# Patient Record
Sex: Male | Born: 1970 | Race: Black or African American | Hispanic: No | Marital: Single | Smoking: Current every day smoker
Health system: Southern US, Community
[De-identification: ages and names within clinical notes are randomized; demographics above are authoritative.]

## PROBLEM LIST (undated history)

## (undated) DIAGNOSIS — F32A Depression, unspecified: Secondary | ICD-10-CM

## (undated) DIAGNOSIS — R569 Unspecified convulsions: Secondary | ICD-10-CM

## (undated) DIAGNOSIS — E669 Obesity, unspecified: Secondary | ICD-10-CM

## (undated) DIAGNOSIS — F329 Major depressive disorder, single episode, unspecified: Secondary | ICD-10-CM

## (undated) HISTORY — PX: LACERATION REPAIR: SHX5168

## (undated) HISTORY — PX: DESTRUCTION LESION PENILE: SUR414

---

## 2005-07-11 ENCOUNTER — Emergency Department: Payer: Self-pay | Admitting: Emergency Medicine

## 2009-09-01 ENCOUNTER — Emergency Department: Payer: Self-pay | Admitting: Emergency Medicine

## 2012-04-11 ENCOUNTER — Inpatient Hospital Stay: Payer: Self-pay | Admitting: Internal Medicine

## 2012-04-11 LAB — CARBAMAZEPINE LEVEL, TOTAL: Carbamazepine: 3.8 ug/mL — ABNORMAL LOW (ref 4.0–12.0)

## 2012-04-11 LAB — COMPREHENSIVE METABOLIC PANEL
Anion Gap: 9 (ref 7–16)
BUN: 11 mg/dL (ref 7–18)
Calcium, Total: 8.9 mg/dL (ref 8.5–10.1)
Chloride: 103 mmol/L (ref 98–107)
Co2: 26 mmol/L (ref 21–32)
EGFR (African American): >60
EGFR (Non-African Amer.): >60
Osmolality: 276 (ref 275–301)
Potassium: 3.5 mmol/L (ref 3.5–5.1)
SGOT(AST): 24 U/L (ref 15–37)
SGPT (ALT): 24 U/L (ref 12–78)
Total Protein: 8.2 g/dL (ref 6.4–8.2)

## 2012-04-11 LAB — URINALYSIS, COMPLETE
Bilirubin,UR: NEGATIVE
Hyaline Cast: 1
Nitrite: NEGATIVE
Ph: 6 (ref 4.5–8.0)
Protein: NEGATIVE
Specific Gravity: 1.012 (ref 1.003–1.030)

## 2012-04-11 LAB — CBC WITH DIFFERENTIAL/PLATELET
Basophil %: 0.4 %
Eosinophil #: 0 10*3/uL (ref 0.0–0.7)
Eosinophil %: 0.6 %
HCT: 42.8 % (ref 40.0–52.0)
HGB: 14.7 g/dL (ref 13.0–18.0)
Lymphocyte #: 1 10*3/uL (ref 1.0–3.6)
Lymphocyte %: 13.9 %
MCH: 31.8 pg (ref 26.0–34.0)
MCHC: 34.3 g/dL (ref 32.0–36.0)
MCV: 93 fL (ref 80–100)
Neutrophil #: 5.7 10*3/uL (ref 1.4–6.5)
RBC: 4.61 10*6/uL (ref 4.40–5.90)

## 2012-04-12 LAB — CBC WITH DIFFERENTIAL/PLATELET
Basophil #: 0 10*3/uL (ref 0.0–0.1)
Eosinophil #: 0 10*3/uL (ref 0.0–0.7)
Eosinophil %: 0.6 %
Lymphocyte #: 1.7 10*3/uL (ref 1.0–3.6)
Lymphocyte %: 21.7 %
MCH: 31.9 pg (ref 26.0–34.0)
Monocyte #: 0.8 x10 3/mm (ref 0.2–1.0)
Monocyte %: 10.2 %
Neutrophil %: 67 %
Platelet: 199 10*3/uL (ref 150–440)
RBC: 4.4 10*6/uL (ref 4.40–5.90)
RDW: 13.9 % (ref 11.5–14.5)
WBC: 8 10*3/uL (ref 3.8–10.6)

## 2012-04-12 LAB — BASIC METABOLIC PANEL
Anion Gap: 8 (ref 7–16)
BUN: 10 mg/dL (ref 7–18)
Calcium, Total: 8.9 mg/dL (ref 8.5–10.1)
Creatinine: 1.05 mg/dL (ref 0.60–1.30)
EGFR (African American): >60
EGFR (Non-African Amer.): >60
Glucose: 109 mg/dL — ABNORMAL HIGH (ref 65–99)
Osmolality: 275 (ref 275–301)
Potassium: 3.1 mmol/L — ABNORMAL LOW (ref 3.5–5.1)

## 2014-12-09 NOTE — H&P (Signed)
PATIENT NAME:  Devon Roberson, Devon Roberson MR#:  [id number] DATE OF BIRTH:  [birth year]  DATE OF ADMISSION:  04/11/2012  PRIMARY CARE PROVIDER: None.  ED REFERRING PHYSICIAN: Dr. Ulice Brilliant    CHIEF COMPLAINT: Multiple seizures.   HISTORY OF PRESENT ILLNESS: The patient is a 44 year old African American male who has been recently incarcerated in prison who has a history of recurrent seizures. He is on Keppra and is also on carbamazepine for his seizure disorder who was incarcerated and those medications were not on the formulary and he was not getting his Keppra. The patient had a tonic-clonic seizure in the prison and had a few of those in the prison. Exact amount is unknown. After he was brought to the ED he had more seizures, two more episodes in the ED, tonic-clonic lasting five minutes. The patient was given p.o. Keppra as well as IV Keppra. He also received a dose of Ativan. Currently he is postictal and is unable to give me any further history.   PAST MEDICAL HISTORY:  1. Seizure disorder.  2. Depression.   PAST SURGICAL HISTORY: Unknown.   ALLERGIES: None.  MEDICATIONS AT THE PRISON:  1. Carbamazepine 200 mg 4 tabs b.i.d.  2. Keppra XR 1000 mg at bedtime.  3. Paroxetine 20 daily.   SOCIAL HISTORY: The patient is unable to tell me whether he smokes, drinks, or uses drugs. He is currently incarcerated.   FAMILY HISTORY: Not known.    REVIEW OF SYSTEMS: The patient is currently postictal and very lethargic and hard to obtain.   PHYSICAL EXAMINATION:   VITAL SIGNS: Temperature 98, pulse 85, respirations 23, blood pressure 128/69, O2 93%.   GENERAL: The patient is an obese male in no acute distress, is currently drowsy and postictal.   HEENT: Head atraumatic, normocephalic. Pupils equally round, reactive to light and accommodation. There is no conjunctival pallor. No scleral icterus. Nasal exam shows no drainage or ulceration. Oropharynx is clear. No exudates.   NECK: No thyromegaly. No  carotid bruits.   CARDIOVASCULAR: Regular rate and rhythm. No murmurs, rubs, clicks, or gallops. PMI is not displaced.   LUNGS: Clear to auscultation bilaterally without any rales, rhonchi, or wheezing.   ABDOMEN: Soft, nontender, nondistended. Positive bowel sounds x4.   EXTREMITIES: No clubbing, cyanosis, or edema.   SKIN: No rash.   LYMPHATICS: No lymph nodes palpable.   VASCULAR: Good DP, PT pulses.   NEUROLOGICAL: The patient is currently lethargic but was moving all four extremities spontaneously.   EVALUATION IN THE ED: CT scan of the head which was negative.   Right shoulder x-ray showed no abnormality.   WBC 7.3, hemoglobin 14.7, platelet count 197. BMP glucose 107, BUN 11, creatinine 1.03, sodium 138, potassium 3.5, chloride 103, CO2 26. LFTs showed a slightly elevated alkaline phosphatase of 151.   CT scan of the head showed no acute abnormality.   ASSESSMENT AND PLAN: The patient is a 44 year old African American male with history of seizure disorder who is brought to the ED with recurrent seizures. The patient is incarcerated and has not been getting his medications. This is likely the cause for his seizure.  1. Recurrent seizure due to the patient missing dose of his seizure medications. At this time he has already been given IV Keppra. Will continue higher dose p.o. Keppra. Will do Ativan p.r.n. Continue carbamazepine as taking previously. Will also check Tegretol level. We will not get Keppra level due to it will likely take a long time to get  those back and will not change his management.  2. Depression. Will continue Paxil.  3. Miscellaneous. I will place him on Lovenox for DVT prophylaxis.   TIME SPENT: 35 minutes.   ____________________________ Lafonda Mosses Posey Pronto, MD shp:drc D: 04/11/2012 11:39:56 ET T: 04/11/2012 12:04:42 ET JOB#: [id number]  cc: Arleigh Dicola H. Posey Pronto, MD, <Dictator> Alric Seton MD ELECTRONICALLY SIGNED 04/13/2012 16:02

## 2014-12-09 NOTE — Discharge Summary (Signed)
PATIENT NAME:  Devon Roberson, Devon Roberson MR#:  [id number] DATE OF BIRTH:  1970-10-19  DATE OF ADMISSION:  04/11/2012 DATE OF DISCHARGE:  04/12/2012   ADMITTING DIAGNOSIS: Recurrent seizures.   DISCHARGE DIAGNOSES:  1. Recurrent seizures due to patient not getting his seizure medication. The patient was restarted back on his Keppra and carbamazepine. He has not had any further seizure since last night.  2. Depression.  3. Hypokalemia.   CONSULTANTS: None.   PERTINENT LABS AND EVALUATIONS: Admitting glucose 109, BUN 11, creatinine 1.03, sodium 138, potassium 3.5, chloride 103, CO2 26, calcium 8.9. LFTs were normal except alkaline phosphatase was slightly elevated at 151. Tegretol level 3.8. WBC 7.3, hemoglobin 14.7, platelet count 197.   CT scan of the head showed no acute abnormality.   HOSPITAL COURSE: Please refer to history and physical done by the admitting physician. The patient is a 44 year old African American male with history of seizure disorder who is recently incarcerated in prison who is chronically on Keppra and carbamazepine for seizure and apparently he did not receive his dosage of medication because they had to be ordered. The patient was noted to have a few episodes of tonic-clonic activity in the prison and was brought to the ED. He had another episode. In the ER he was loaded with IV Keppra. He received Ativan with resolution of the seizures. The patient was kept on his regular regimen of carbamazepine as well as Keppra. The patient has been stable overnight. He has not had any further seizure activity. At this time he is stable for discharge.   DISCHARGE MEDICATIONS:  1. Epitol 200 mg 4 tabs 2 times per day. 2. Paroxetine 20 daily.  3. Keppra XR 500 extended-release 2 tabs daily at bedtime.   DIET: Regular.   ACTIVITY: As tolerated.   TIMEFRAME FOR FOLLOW-UP: Follow-up in 1 to 2 weeks with prison MD or primary MD when able. The patient is strongly instructed to take his seizure  medications and prison staff is strongly recommended to give him his medications so he does not miss a dose.   TIME SPENT: 31 minutes.   ____________________________ Lafonda Mosses Posey Pronto, MD shp:drc D: 04/12/2012 13:10:58 ET T: 04/12/2012 13:44:25 ET JOB#: [id number]  cc: Ladina Shutters H. Posey Pronto, MD, <Dictator> Alric Seton MD ELECTRONICALLY SIGNED 04/13/2012 16:03

## 2016-02-01 ENCOUNTER — Encounter: Payer: Self-pay | Admitting: *Deleted

## 2016-02-02 ENCOUNTER — Ambulatory Visit: Payer: Self-pay | Admitting: Anesthesiology

## 2016-02-02 ENCOUNTER — Encounter
Admission: RE | Disposition: A | Discharge status: Home / Self Care | Payer: Self-pay | Source: Ambulatory Visit | Attending: Gastroenterology

## 2016-02-02 ENCOUNTER — Ambulatory Visit
Admission: RE | Admit: 2016-02-02 | Discharge: 2016-02-02 | Disposition: A | Discharge status: Home / Self Care | Payer: Self-pay | Source: Ambulatory Visit | Attending: Gastroenterology | Admitting: Gastroenterology

## 2016-02-02 ENCOUNTER — Encounter: Payer: Self-pay | Admitting: Anesthesiology

## 2016-02-02 DIAGNOSIS — Z79899 Other long term (current) drug therapy: Secondary | ICD-10-CM | POA: Insufficient documentation

## 2016-02-02 DIAGNOSIS — K5730 Diverticulosis of large intestine without perforation or abscess without bleeding: Secondary | ICD-10-CM | POA: Insufficient documentation

## 2016-02-02 DIAGNOSIS — F17200 Nicotine dependence, unspecified, uncomplicated: Secondary | ICD-10-CM | POA: Insufficient documentation

## 2016-02-02 DIAGNOSIS — F329 Major depressive disorder, single episode, unspecified: Secondary | ICD-10-CM | POA: Insufficient documentation

## 2016-02-02 DIAGNOSIS — D124 Benign neoplasm of descending colon: Secondary | ICD-10-CM | POA: Insufficient documentation

## 2016-02-02 DIAGNOSIS — E669 Obesity, unspecified: Secondary | ICD-10-CM | POA: Insufficient documentation

## 2016-02-02 DIAGNOSIS — K625 Hemorrhage of anus and rectum: Secondary | ICD-10-CM | POA: Insufficient documentation

## 2016-02-02 DIAGNOSIS — R569 Unspecified convulsions: Secondary | ICD-10-CM | POA: Insufficient documentation

## 2016-02-02 DIAGNOSIS — D125 Benign neoplasm of sigmoid colon: Secondary | ICD-10-CM | POA: Insufficient documentation

## 2016-02-02 DIAGNOSIS — Z6833 Body mass index (BMI) 33.0-33.9, adult: Secondary | ICD-10-CM | POA: Insufficient documentation

## 2016-02-02 DIAGNOSIS — D123 Benign neoplasm of transverse colon: Secondary | ICD-10-CM | POA: Insufficient documentation

## 2016-02-02 HISTORY — DX: Unspecified convulsions: R56.9

## 2016-02-02 HISTORY — DX: Depression, unspecified: F32.A

## 2016-02-02 HISTORY — DX: Major depressive disorder, single episode, unspecified: F32.9

## 2016-02-02 HISTORY — PX: COLONOSCOPY WITH PROPOFOL: SHX5780

## 2016-02-02 HISTORY — DX: Obesity, unspecified: E66.9

## 2016-02-02 SURGERY — COLONOSCOPY WITH PROPOFOL
Anesthesia: General

## 2016-02-02 MED ORDER — PROPOFOL 500 MG/50ML IV EMUL
INTRAVENOUS | Status: DC | PRN
Start: 2016-02-02 — End: 2016-02-02
  Administered 2016-02-02: 120 ug/kg/min via INTRAVENOUS

## 2016-02-02 MED ORDER — SODIUM CHLORIDE 0.9 % IV SOLN
INTRAVENOUS | Status: DC
Start: 2016-02-02 — End: 2016-02-02

## 2016-02-02 MED ORDER — PROPOFOL 10 MG/ML IV BOLUS
INTRAVENOUS | Status: DC | PRN
  Administered 2016-02-02: 80 mg via INTRAVENOUS

## 2016-02-02 MED ORDER — MIDAZOLAM HCL 2 MG/2ML IJ SOLN
INTRAMUSCULAR | Status: DC | PRN
  Administered 2016-02-02: 1 mg via INTRAVENOUS

## 2016-02-02 MED ORDER — SODIUM CHLORIDE 0.9 % IV SOLN
INTRAVENOUS | Status: DC
  Administered 2016-02-02: 15:00:00 via INTRAVENOUS

## 2016-02-02 MED ORDER — FENTANYL CITRATE (PF) 100 MCG/2ML IJ SOLN
INTRAMUSCULAR | Status: DC | PRN
  Administered 2016-02-02: 50 ug via INTRAVENOUS

## 2016-02-02 NOTE — Anesthesia Preprocedure Evaluation (Signed)
Anesthesia Evaluation  Patient identified by MRN, date of birth, ID band Patient awake    Reviewed: Allergy & Precautions, NPO status , Patient's Chart, lab work & pertinent test results, reviewed documented beta blocker date and time   Airway Mallampati: II  TM Distance: >3 FB     Dental  (+) Chipped   Pulmonary Current Smoker,           Cardiovascular      Neuro/Psych Seizures -,  PSYCHIATRIC DISORDERS Depression    GI/Hepatic   Endo/Other    Renal/GU      Musculoskeletal   Abdominal   Peds  Hematology   Anesthesia Other Findings   Reproductive/Obstetrics                             Anesthesia Physical Anesthesia Plan  ASA: III  Anesthesia Plan: General   Post-op Pain Management:    Induction: Intravenous  Airway Management Planned: Nasal Cannula  Additional Equipment:   Intra-op Plan:   Post-operative Plan:   Informed Consent: I have reviewed the patients History and Physical, chart, labs and discussed the procedure including the risks, benefits and alternatives for the proposed anesthesia with the patient or authorized representative who has indicated his/her understanding and acceptance.     Plan Discussed with: CRNA  Anesthesia Plan Comments:         Anesthesia Quick Evaluation

## 2016-02-02 NOTE — Transfer of Care (Signed)
Immediate Anesthesia Transfer of Care Note  Patient: Devon Roberson  Procedure(s) Performed: Procedure(s): COLONOSCOPY WITH PROPOFOL (N/A)  Patient Location: PACU and Endoscopy Unit  Anesthesia Type:General  Level of Consciousness: sedated and responds to stimulation  Airway & Oxygen Therapy: Patient Spontanous Breathing and Patient connected to nasal cannula oxygen  Post-op Assessment: Report given to RN and Post -op Vital signs reviewed and stable  Post vital signs: Reviewed and stable  Last Vitals:  Filed Vitals:   02/02/16 1432 02/02/16 1606  BP: 137/86 105/69  Pulse: 57 54  Temp: 36.7 C 35.7 C  Resp: 18 21    Last Pain: There were no vitals filed for this visit.       Complications: No apparent anesthesia complications

## 2016-02-02 NOTE — H&P (Signed)
Outpatient short stay form Pre-procedure 02/02/2016 3:18 PM Lollie Sails MD  Primary Physician: Dr. Gayland Curry  Reason for visit:  Colonoscopy  History of present illness:  Patient is a 45 year old male presenting today for a colonoscopy. He has a history of some intermittent rectal bleeding. He had a colonoscopy in the past. He also has some lower abdominal cramping prior to defecation which is usually relieved with the defecation. He tolerated his prep well. He denies use of any aspirin products or blood thinning agents.    Current facility-administered medications:  .  0.9 %  sodium chloride infusion, , Intravenous, Continuous, Lollie Sails, MD, Last Rate: 20 mL/hr at 02/02/16 1444 .  0.9 %  sodium chloride infusion, , Intravenous, Continuous, Lollie Sails, MD  Prescriptions prior to admission  Medication Sig Dispense Refill Last Dose  . carbamazepine (TEGRETOL) 200 MG tablet Take 200 mg by mouth 3 (three) times daily.   02/02/2016 at 0800  . levETIRAcetam (KEPPRA) 750 MG tablet Take 1,500 mg by mouth 2 (two) times daily.   02/02/2016 at 0800  . loratadine (CLARITIN) 10 MG tablet Take 10 mg by mouth daily as needed for allergies.     Marland Kitchen PARoxetine (PAXIL) 20 MG tablet Take 20 mg by mouth daily.   02/02/2016 at 0800     No Known Allergies   Past Medical History  Diagnosis Date  . Depression   . Seizures ([place])   . Obesity     Review of systems:      Physical Exam    Heart and lungs: Regular rate and rhythm without rub or gallop, lungs are bilaterally clear.    HEENT: Normocephalic atraumatic eyes are anicteric    Other:     Pertinant exam for procedure: Soft nontender nondistended bowel sounds positive normoactive.  Planned proceedures: Colonoscopy and indicated procedures. I have discussed the risks benefits and complications of procedures to include not limited to bleeding, infection, perforation and the risk of sedation and the patient wishes to  proceed.    Lollie Sails, MD Gastroenterology 02/02/2016  3:18 PM

## 2016-02-02 NOTE — Op Note (Signed)
[facility]enter[facility] Patient Name: Devon Roberson Procedure Date: 02/02/2016 3:20 PM MRN: [id number] Account #: [id number] Date of Birth: 04/11/1971 Admit Type: Outpatient Age: 45 Room: [facility] ENDO ROOM 3 Gender: Male Note Status: Finalized Procedure:            Colonoscopy Indications:          Screening for colorectal malignant neoplasm, This is                        the patient's first colonoscopy Providers:            Lollie Sails, MD Referring MD:         Gayland Curry MD, MD (Referring MD) Medicines:            Monitored Anesthesia Care Complications:        No immediate complications. Procedure:            Pre-Anesthesia Assessment:                       - ASA Grade Assessment: II - A patient with mild                        systemic disease.                       After obtaining informed consent, the colonoscope was                        passed under direct vision. Throughout the procedure,                        the patient's blood pressure, pulse, and oxygen                        saturations were monitored continuously. The                        Colonoscope was introduced through the anus and                        advanced to the the cecum, identified by appendiceal                        orifice and ileocecal valve. The colonoscopy was                        performed without difficulty. The patient tolerated the                        procedure well. The quality of the bowel preparation                        was good. Findings:      A 2 mm polyp was found in the distal descending colon. The polyp was       sessile. The polyp was removed with a cold biopsy forceps. Resection and       retrieval were complete.      A 3 mm polyp was found in the transverse colon. The polyp was sessile.       The polyp was removed with a cold biopsy forceps. Resection  and       retrieval were complete.      A 4 mm polyp was found in the splenic  flexure. The polyp was flat. The       polyp was removed with a cold snare. Resection and retrieval were       complete.      A 3 mm polyp was found in the mid sigmoid colon. The polyp was sessile.       The polyp was removed with a cold biopsy forceps. Resection and       retrieval were complete.      A few small-mouthed diverticula were found in the sigmoid colon and       descending colon.      The digital rectal exam was normal. Impression:           - One 2 mm polyp in the distal descending colon,                        removed with a cold biopsy forceps. Resected and                        retrieved.                       - One 3 mm polyp in the transverse colon, removed with                        a cold biopsy forceps. Resected and retrieved.                       - One 4 mm polyp at the splenic flexure, removed with a                        cold snare. Resected and retrieved.                       - One 3 mm polyp in the mid sigmoid colon, removed with                        a cold biopsy forceps. Resected and retrieved.                       - Diverticulosis in the sigmoid colon and in the                        descending colon. Recommendation:       - Discharge patient to home. Procedure Code(s):    --- Professional ---                       [phone], Colonoscopy, flexible; with removal of tumor(s),                        polyp(s), or other lesion(s) by snare technique                       [id number], 2, Colonoscopy, flexible; with biopsy, single                        or multiple Diagnosis Code(s):    --- Professional ---  Z12.11, Encounter for screening for malignant neoplasm                        of colon                       D12.4, Benign neoplasm of descending colon                       D12.3, Benign neoplasm of transverse colon (hepatic                        flexure or splenic flexure)                       D12.5, Benign neoplasm of sigmoid colon                        K57.30, Diverticulosis of large intestine without                        perforation or abscess without bleeding CPT copyright 2016 American Medical Association. All rights reserved. The codes documented in this report are preliminary and upon coder review may  be revised to meet current compliance requirements. Lollie Sails, MD 02/02/2016 4:03:33 PM This report has been signed electronically. Number of Addenda: 0 Note Initiated On: 02/02/2016 3:20 PM Scope Withdrawal Time: 0 hours 14 minutes 52 seconds  Total Procedure Duration: 0 hours 24 minutes 21 seconds       [facility]

## 2016-02-03 ENCOUNTER — Encounter: Payer: Self-pay | Admitting: Gastroenterology

## 2016-02-03 NOTE — Anesthesia Postprocedure Evaluation (Signed)
Anesthesia Post Note  Patient: Devon Roberson  Procedure(s) Performed: Procedure(s) (LRB): COLONOSCOPY WITH PROPOFOL (N/A)  Patient location during evaluation: PACU Level of consciousness: awake and alert and oriented Pain management: pain level controlled Vital Signs Assessment: post-procedure vital signs reviewed and stable Respiratory status: spontaneous breathing Cardiovascular status: blood pressure returned to baseline Anesthetic complications: no    Last Vitals:  Filed Vitals:   02/02/16 1626 02/02/16 1636  BP: 128/92 132/86  Pulse: 53 59  Temp:    Resp: 20 18    Last Pain: There were no vitals filed for this visit.               [name]

## 2016-02-04 LAB — SURGICAL PATHOLOGY

## 2019-05-16 ENCOUNTER — Emergency Department: Payer: Medicaid Other

## 2019-05-16 ENCOUNTER — Other Ambulatory Visit: Payer: Self-pay

## 2019-05-16 ENCOUNTER — Encounter: Payer: Self-pay | Admitting: Emergency Medicine

## 2019-05-16 ENCOUNTER — Emergency Department
Admission: EM | Admit: 2019-05-16 | Discharge: 2019-05-16 | Disposition: A | Discharge status: Home / Self Care | Payer: Medicaid Other | Attending: Emergency Medicine | Admitting: Emergency Medicine

## 2019-05-16 DIAGNOSIS — Y9389 Activity, other specified: Secondary | ICD-10-CM | POA: Insufficient documentation

## 2019-05-16 DIAGNOSIS — Y929 Unspecified place or not applicable: Secondary | ICD-10-CM | POA: Diagnosis not present

## 2019-05-16 DIAGNOSIS — S43015A Anterior dislocation of left humerus, initial encounter: Secondary | ICD-10-CM | POA: Diagnosis not present

## 2019-05-16 DIAGNOSIS — X58XXXA Exposure to other specified factors, initial encounter: Secondary | ICD-10-CM | POA: Insufficient documentation

## 2019-05-16 DIAGNOSIS — Y999 Unspecified external cause status: Secondary | ICD-10-CM | POA: Insufficient documentation

## 2019-05-16 DIAGNOSIS — F17210 Nicotine dependence, cigarettes, uncomplicated: Secondary | ICD-10-CM | POA: Diagnosis not present

## 2019-05-16 DIAGNOSIS — S43005A Unspecified dislocation of left shoulder joint, initial encounter: Secondary | ICD-10-CM

## 2019-05-16 DIAGNOSIS — S4992XA Unspecified injury of left shoulder and upper arm, initial encounter: Secondary | ICD-10-CM | POA: Diagnosis present

## 2019-05-16 MED ORDER — HYDROMORPHONE HCL 1 MG/ML IJ SOLN
1.0000 mg | Freq: Once | INTRAMUSCULAR | Status: AC
  Administered 2019-05-16: 1 mg via INTRAVENOUS
  Filled 2019-05-16: qty 1

## 2019-05-16 MED ORDER — HYDROMORPHONE HCL 1 MG/ML IJ SOLN
1.0000 mg | Freq: Once | INTRAMUSCULAR | Status: AC
  Administered 2019-05-16: 13:00:00 1 mg via INTRAVENOUS
  Filled 2019-05-16: qty 1

## 2019-05-16 MED ORDER — OXYCODONE-ACETAMINOPHEN 5-325 MG PO TABS: 1.0000 | ORAL_TABLET | Freq: Two times a day (BID) | ORAL | 0 refills | Status: DC | PRN

## 2019-05-16 MED ORDER — DIAZEPAM 5 MG PO TABS
10.0000 mg | ORAL_TABLET | Freq: Once | ORAL | Status: AC
  Administered 2019-05-16: 13:00:00 10 mg via ORAL
  Filled 2019-05-16: qty 2

## 2019-05-16 NOTE — ED Notes (Signed)
Patient placed on monitor

## 2019-05-16 NOTE — ED Notes (Signed)
Attempted to call pt ride at this time with no answer, will try again

## 2019-05-16 NOTE — ED Triage Notes (Signed)
Pt states he was working under a bike yesterday, left shoulder dislocated, pt states he has popped it back into place 3 times already, this last time, unable to pop it in place, has been out since last night, appears in pain.

## 2019-05-16 NOTE — ED Provider Notes (Signed)
[facility] Emergency Department Provider Note       Time seen: ----------------------------------------- 12:23 PM on 05/16/2019 -----------------------------------------   I have reviewed the triage vital signs and the nursing notes.  HISTORY   Chief Complaint Shoulder Injury    HPI Devon Roberson is a 48 y.o. male with a history of depression, obesity, seizures who presents to the ED for left shoulder pain.  Patient states he was working under his bike yesterday when his left shoulder dislocated.  He was able to pop it back in place 3 times yesterday.  He states he it has been dislocated for almost 12 hours.  When he would roll over in his sleep it would dislocate.  Patient describes 10 out of 10 in the left shoulder pain.  Past Medical History:  Diagnosis Date  . Depression   . Obesity   . Seizures ([place])     There are no active problems to display for this patient.   Past Surgical History:  Procedure Laterality Date  . COLONOSCOPY WITH PROPOFOL N/A 02/02/2016   Procedure: COLONOSCOPY WITH PROPOFOL;  Surgeon: Lollie Sails, MD;  Location: [facility] ENDOSCOPY;  Service: Endoscopy;  Laterality: N/A;  . DESTRUCTION LESION PENILE Bilateral   . LACERATION REPAIR Bilateral Orbital Larceration Repair    Allergies Patient has no known allergies.  Social History Social History   Tobacco Use  . Smoking status: Current Every Day Smoker    Packs/day: 0.50    Years: 26.00    Pack years: 13.00    Types: Cigarettes  . Smokeless tobacco: Never Used  Substance Use Topics  . Alcohol use: No  . Drug use: Yes    Types: Marijuana   Review of Systems Constitutional: Negative for fever. Cardiovascular: Negative for chest pain. Respiratory: Negative for shortness of breath. Musculoskeletal: Positive for left shoulder pain Skin: Negative for rash. Neurological: Negative for headaches, focal weakness or numbness.  All systems negative/normal/unremarkable  except as stated in the HPI  ____________________________________________   PHYSICAL EXAM:  VITAL SIGNS: ED Triage Vitals  Enc Vitals Group     BP 05/16/19 1140 (!) 178/113     Pulse Rate 05/16/19 1140 79     Resp 05/16/19 1140 20     Temp 05/16/19 1140 99.1 F (37.3 C)     Temp Source 05/16/19 1140 Oral     SpO2 05/16/19 1140 97 %     Weight 05/16/19 1141 244 lb (110.7 kg)     Height 05/16/19 1141 5\' 11"  (1.803 m)     Head Circumference --      Peak Flow --      Pain Score 05/16/19 1140 10     Pain Loc --      Pain Edu? --      Excl. in [place]? --    Constitutional: Alert and oriented. Well appearing and in no distress. Eyes: Conjunctivae are normal. Normal extraocular movements. Cardiovascular: Normal rate, regular rhythm. No murmurs, rubs, or gallops. Respiratory: Normal respiratory effort without tachypnea nor retractions. Breath sounds are clear and equal bilaterally. No wheezes/rales/rhonchi. Musculoskeletal: Obvious deformity noted about the left shoulder consistent with anterior shoulder dislocation, left upper extremity is neurovascular intact Neurologic:  Normal speech and language. No gross focal neurologic deficits are appreciated.  Skin:  Skin is warm, dry and intact. No rash noted. ____________________________________________  ED COURSE:  As part of my medical decision making, I reviewed the following data within the [place] History obtained  from family if available, nursing notes, old chart and ekg, as well as notes from prior ED visits. Patient presented for left shoulder pain and dislocation, we will assess with labs and imaging as indicated at this time.  Shoulder was reduced by placing the patient in a supine position.  The elbow was then brought into his side and full adduction.  I then utilized external rotation of the shoulder joint with audible and visible reduction in his left shoulder.  This was confirmed by x-ray.    Procedures  Devon Roberson was evaluated in Emergency Department on 05/16/2019 for the symptoms described in the history of present illness. He was evaluated in the context of the global COVID-19 pandemic, which necessitated consideration that the patient might be at risk for infection with the SARS-CoV-2 virus that causes COVID-19. Institutional protocols and algorithms that pertain to the evaluation of patients at risk for COVID-19 are in a state of rapid change based on information released by regulatory bodies including the CDC and federal and state organizations. These policies and algorithms were followed during the patient's care in the ED.  ____________________________________________   RADIOLOGY Images were viewed by me  Left shoulder x-rays IMPRESSION: 1. Anterior inferior left shoulder dislocation. 2. Question cortical irregularity of the glenoid rim. Attention on postreduction images.  IMPRESSION: Successful reduction of anterior dislocation. Currently no fracture or dislocation. 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 No erosive changes.  ____________________________________________   DIFFERENTIAL DIAGNOSIS   Dislocation, fracture, strain  FINAL ASSESSMENT AND PLAN  Left shoulder dislocation, status post reduction   Plan: The patient had presented for left shoulder dislocation.  Patient's imaging revealed an initial anterior dislocation followed by successful reduction.  He was placed in a shoulder immobilizer and will be referred to orthopedics for follow-up.   Laurence Aly, MD    Note: This note was generated in part or whole with voice recognition software. Voice recognition is usually quite accurate but there are transcription errors that can and very often do occur. I apologize for any typographical errors that were not detected and corrected.     Earleen Newport, MD 05/16/19 [phone]

## 2019-09-23 ENCOUNTER — Encounter: Payer: Self-pay | Admitting: Emergency Medicine

## 2019-09-23 ENCOUNTER — Emergency Department: Payer: Medicaid Other

## 2019-09-23 ENCOUNTER — Emergency Department
Admission: EM | Admit: 2019-09-23 | Discharge: 2019-09-23 | Disposition: A | Discharge status: Home / Self Care | Payer: Medicaid Other | Attending: Emergency Medicine | Admitting: Emergency Medicine

## 2019-09-23 ENCOUNTER — Other Ambulatory Visit: Payer: Self-pay

## 2019-09-23 DIAGNOSIS — F17210 Nicotine dependence, cigarettes, uncomplicated: Secondary | ICD-10-CM | POA: Insufficient documentation

## 2019-09-23 DIAGNOSIS — M5136 Other intervertebral disc degeneration, lumbar region: Secondary | ICD-10-CM

## 2019-09-23 DIAGNOSIS — M545 Low back pain: Secondary | ICD-10-CM | POA: Diagnosis present

## 2019-09-23 DIAGNOSIS — Z79899 Other long term (current) drug therapy: Secondary | ICD-10-CM | POA: Insufficient documentation

## 2019-09-23 DIAGNOSIS — M5416 Radiculopathy, lumbar region: Secondary | ICD-10-CM | POA: Diagnosis not present

## 2019-09-23 DIAGNOSIS — M5126 Other intervertebral disc displacement, lumbar region: Secondary | ICD-10-CM

## 2019-09-23 MED ORDER — ORPHENADRINE CITRATE 30 MG/ML IJ SOLN
60.0000 mg | Freq: Two times a day (BID) | INTRAMUSCULAR | Status: DC
  Administered 2019-09-23: 60 mg via INTRAVENOUS
  Filled 2019-09-23: qty 2

## 2019-09-23 MED ORDER — OXYCODONE HCL 5 MG PO TABS: 5.0000 mg | ORAL_TABLET | Freq: Three times a day (TID) | ORAL | 0 refills | Status: AC | PRN

## 2019-09-23 MED ORDER — KETOROLAC TROMETHAMINE 30 MG/ML IJ SOLN
30.0000 mg | Freq: Once | INTRAMUSCULAR | Status: AC
  Administered 2019-09-23: 13:00:00 30 mg via INTRAVENOUS
  Filled 2019-09-23: qty 1

## 2019-09-23 MED ORDER — OXYCODONE HCL 5 MG PO TABS
10.0000 mg | ORAL_TABLET | Freq: Once | ORAL | Status: AC
  Administered 2019-09-23: 13:00:00 10 mg via ORAL
  Filled 2019-09-23: qty 2

## 2019-09-23 MED ORDER — CYCLOBENZAPRINE HCL 10 MG PO TABS: 10.0000 mg | ORAL_TABLET | Freq: Three times a day (TID) | ORAL | 0 refills | Status: AC | PRN

## 2019-09-23 MED ORDER — FENTANYL CITRATE (PF) 100 MCG/2ML IJ SOLN
100.0000 ug | Freq: Once | INTRAMUSCULAR | Status: AC
  Administered 2019-09-23: 100 ug via INTRAVENOUS
  Filled 2019-09-23: qty 2

## 2019-09-23 MED ORDER — DEXAMETHASONE SODIUM PHOSPHATE 10 MG/ML IJ SOLN
10.0000 mg | Freq: Once | INTRAMUSCULAR | Status: AC
  Administered 2019-09-23: 13:00:00 10 mg via INTRAVENOUS
  Filled 2019-09-23: qty 1

## 2019-09-23 MED ORDER — MELOXICAM 15 MG PO TABS: 15.0000 mg | ORAL_TABLET | Freq: Every day | ORAL | 0 refills | Status: AC

## 2019-09-23 NOTE — ED Triage Notes (Signed)
Right hip pain x 1 week.  Has been seen through [facility] ED and states also PCP who gave him pain medication, but no relief from pain.

## 2019-09-23 NOTE — ED Notes (Signed)
See triage note  Presents with lower back pain   States he has had lower back pain for about 6 weeks   States pain is getting worse   Unable to bear full wt  Stats he also has used the bathroom on self

## 2019-09-23 NOTE — Discharge Instructions (Signed)
Please call to make your appointment with the orthopedic specialist.  Take the medication as prescribed.  Return to the ER for symptoms that change or worsen if unable to schedule an appointment.

## 2019-09-23 NOTE — ED Provider Notes (Signed)
[facility] Emergency Department Provider Note ____________________________________________  Time seen: Approximately 4:30 PM  I have reviewed the triage vital signs and the nursing notes.  HISTORY  Chief Complaint Hip Pain   HPI Devon Roberson is a 49 y.o. male presents to the emergency department for treatment and evaluation of back pain.  Pain started December 21 and has progressively worsening since then.  He has been evaluated by the [facility] emergency department as well as primary care provider.  An x-ray was taken and pain medication was prescribed.  He has not had much relief from the pain medication.  Pain is now radiating down the entire right leg on the back and front side with radiation now into the groin area.  Pain is preventing him from being able to sleep.  He states that occasionally his right knee, gives out.  Primary care provider had referred him to [facility] but he has not yet had that appointment.  Past Medical History:  Diagnosis Date  . Depression   . Obesity   . Seizures ([place])     There are no problems to display for this patient.   Past Surgical History:  Procedure Laterality Date  . COLONOSCOPY WITH PROPOFOL N/A 02/02/2016   Procedure: COLONOSCOPY WITH PROPOFOL;  Surgeon: Lollie Sails, MD;  Location: [facility] ENDOSCOPY;  Service: Endoscopy;  Laterality: N/A;  . DESTRUCTION LESION PENILE Bilateral   . LACERATION REPAIR Bilateral Orbital Larceration Repair    Prior to Admission medications   Medication Sig Start Date End Date Taking? Authorizing Provider  carbamazepine (TEGRETOL) 200 MG tablet Take 200 mg by mouth 3 (three) times daily.    [provider]  cyclobenzaprine (FLEXERIL) 10 MG tablet Take 1 tablet (10 mg total) by mouth 3 (three) times daily as needed. 09/23/19   Miranda Frese, Johnette Abraham B, FNP  levETIRAcetam (KEPPRA) 750 MG tablet Take 1,500 mg by mouth 2 (two) times daily.    [provider]  loratadine  (CLARITIN) 10 MG tablet Take 10 mg by mouth daily as needed for allergies.    [provider]  meloxicam (MOBIC) 15 MG tablet Take 1 tablet (15 mg total) by mouth daily. 09/23/19 09/22/20  Waylin Dorko, Johnette Abraham B, FNP  oxyCODONE (ROXICODONE) 5 MG immediate release tablet Take 1 tablet (5 mg total) by mouth every 8 (eight) hours as needed. 09/23/19 09/22/20  Gedeon Brandow, Johnette Abraham B, FNP  PARoxetine (PAXIL) 20 MG tablet Take 20 mg by mouth daily.    [provider]    Allergies Patient has no known allergies.  No family history on file.  Social History Social History   Tobacco Use  . Smoking status: Current Every Day Smoker    Packs/day: 0.50    Years: 26.00    Pack years: 13.00    Types: Cigarettes  . Smokeless tobacco: Never Used  Substance Use Topics  . Alcohol use: No  . Drug use: Yes    Types: Marijuana    Review of Systems Constitutional: Well appearing. Respiratory: Negative for dyspnea. Cardiovascular: Negative for change in skin temperature or color. Musculoskeletal:   Negative for chronic steroid use   Negative for trauma in the presence of osteoporosis  Negative for age over 75 and trauma.  Negative for constitutional symptoms, or history of cancer.  Negative for pain worse at night. Skin: Negative for rash, lesion, or wound.  Genitourinary: Negative for urinary retention. Rectal: Negative for fecal incontinence or new onset constipation/bowel habit changes. Hematological/Immunilogical: Negative for immunosuppression,  IV drug use, or fever Neurological:                 Positive for burning, tingling, numb, electric, radiating pain in the right lower extremity.                Negative for saddle anesthesia.                Negative for focal neurologic deficit, progressive or disabling symptoms     Negative for saddle anesthesia. ____________________________________________   PHYSICAL EXAM:  VITAL SIGNS: ED Triage Vitals  Enc Vitals Group     BP 09/23/19 1127  (!) 177/130     Pulse Rate 09/23/19 1127 97     Resp 09/23/19 1127 20     Temp 09/23/19 1127 97.8 F (36.6 C)     Temp Source 09/23/19 1127 Oral     SpO2 09/23/19 1127 98 %     Weight 09/23/19 1127 244 lb 0.8 oz (110.7 kg)     Height 09/23/19 1127 5\' 11"  (1.803 m)     Head Circumference --      Peak Flow --      Pain Score 09/23/19 1126 10     Pain Loc --      Pain Edu? --      Excl. in [place]? --     Constitutional: Alert and oriented. Well appearing and in no acute distress. Eyes: Conjunctivae are clear without discharge or drainage.  Head: Atraumatic. Neck: Full, active range of motion. Respiratory: Respirations even and unlabored. Musculoskeletal: Limited ROM of the back and right lower extremity, Strength 5/5 of the lower extremities as tested. Neurologic: Reflexes of the lower extremities are 2+. Positive straight leg raise on the right side. Skin: Atraumatic.  Psychiatric: Behavior and affect are normal.  ____________________________________________   LABS (all labs ordered are listed, but only abnormal results are displayed)  Labs Reviewed - No data to display ____________________________________________  RADIOLOGY  MRI shows a large L4-5 disc extrusion resulting in severe right lateral recess stenosis and L4 nerve root impingement.  Moderate to severe right neural foraminal stenosis at L3-4 ____________________________________________   PROCEDURES  Procedure(s) performed:  Procedures ____________________________________________   INITIAL IMPRESSION / ASSESSMENT AND PLAN / ED COURSE  Devon Roberson is a 49 y.o. male presenting to the emergency department for treatment and evaluation of about 6 weeks of back pain that has been progressively worsening.  He did state that he thinks he may have soiled himself but denies frequent bowel or bladder incontinence.  He is still able to bear weight but walking or attempting to lie down is very uncomfortable.  Patient states  that he is very restless at night and is unable to find a comfortable way to sleep.  Because pain has been greater than 6 weeks and is worsening, pain radiating into the groin, and a possible episode of fecal incontinence MR of the lumbar spine to be done today.  Differential diagnosis includes but is not limited to: Sciatica, herniation of disc, nerve root compression, epidural abscess, cauda equina, disc degeneration of the lumbar spine.  MRI does show a large L4-L5 disc extrusion with L4 nerve root impingement.  This does explain the patient's pain.  While here, he was given Decadron, Norflex, Toradol, and oral oxycodone with significant relief.  Patient was able to lie on the stretcher and rest.  Results were discussed with the patient who would like to follow-up with [facility].  He was  encouraged to call them this afternoon to request a follow-up appointment.  He will be given prescriptions for oxycodone, Flexeril, and meloxicam.  He is to see primary care or return to the emergency department for symptoms that change or worsen until he is able to see orthopedics.  Medications  orphenadrine (NORFLEX) injection 60 mg (60 mg Intravenous Given 09/23/19 1242)  ketorolac (TORADOL) 30 MG/ML injection 30 mg (30 mg Intravenous Given 09/23/19 1243)  oxyCODONE (Oxy IR/ROXICODONE) immediate release tablet 10 mg (10 mg Oral Given 09/23/19 1245)  dexamethasone (DECADRON) injection 10 mg (10 mg Intravenous Given 09/23/19 1244)  fentaNYL (SUBLIMAZE) injection 100 mcg (100 mcg Intravenous Given 09/23/19 1438)    ED Discharge Orders         Ordered    oxyCODONE (ROXICODONE) 5 MG immediate release tablet  Every 8 hours PRN     09/23/19 1409    cyclobenzaprine (FLEXERIL) 10 MG tablet  3 times daily PRN     09/23/19 1409    meloxicam (MOBIC) 15 MG tablet  Daily     09/23/19 1409           Pertinent labs & imaging results that were available during my care of the patient were reviewed by me and considered in my  medical decision making (see chart for details).  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    Victorino Dike, FNP 09/23/19 1652    Earleen Newport, MD 09/24/19 [phone]

## 2019-09-23 NOTE — ED Notes (Signed)
Sherrie George, NP notified about patients blood pressure - 141/109

## 2021-04-13 IMAGING — MR MR LUMBAR SPINE W/O CM
5 of 6 series · 30 of 48 positions shown · non-contrast
Comparison: None.

CLINICAL DATA: Low back pain for 6 weeks.

EXAM:
MRI LUMBAR SPINE WITHOUT CONTRAST
TECHNIQUE: Multiplanar, multisequence MR imaging of the lumbar spine was
performed. No intravenous contrast was administered.

[Series 5: T2 · sagittal · 4.0mm · 0.81mm/px · 5 of 17 slices shown (1 of 2)]
[im 1/17]
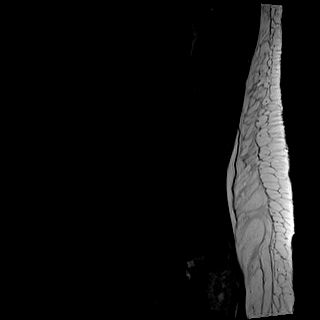
[im 5/17]
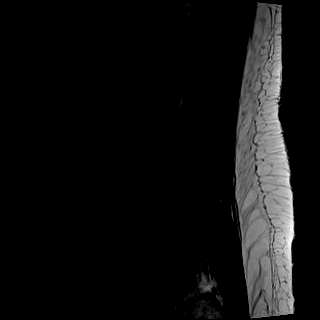
[im 9/17]
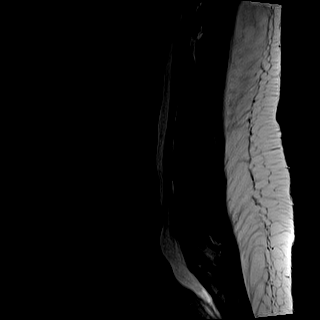
[im 13/17]
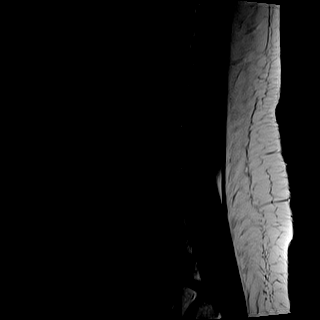
[im 17/17]
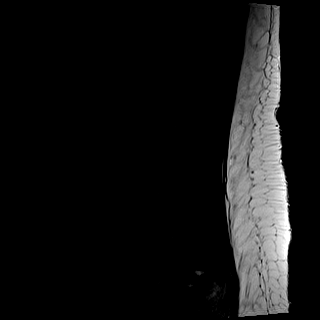

[Series 6: T1 · sagittal · 4.0mm · 1.02mm/px · 5 of 15 slices shown (1 of 2)]
[im 1/15]
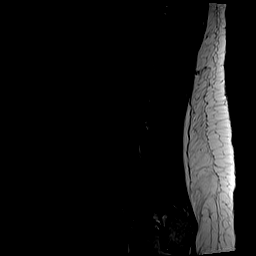
[im 4/15]
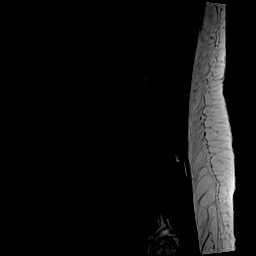
[im 8/15]
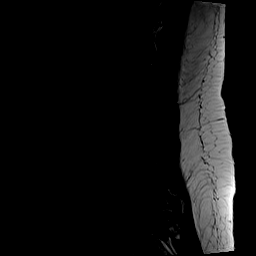
[im 11/15]
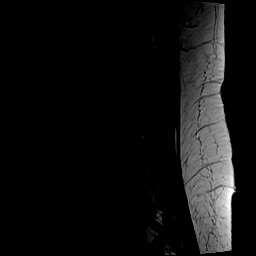
[im 15/15]
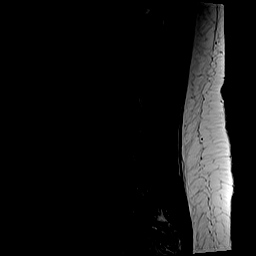

[Series 7: STIR · sagittal · 4.0mm · 0.55mm/px · 4 of 15 slices shown]
[im 1/15]
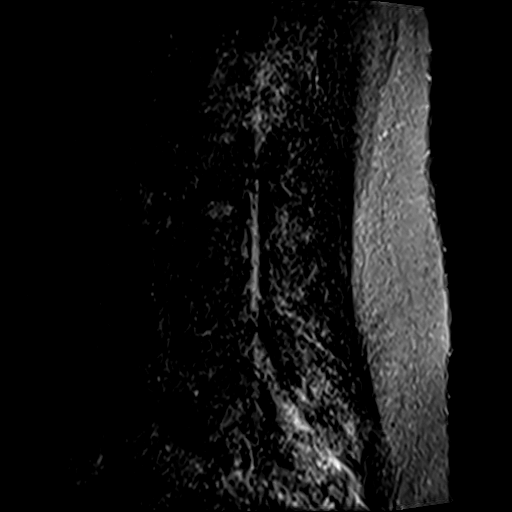
[im 4/15]
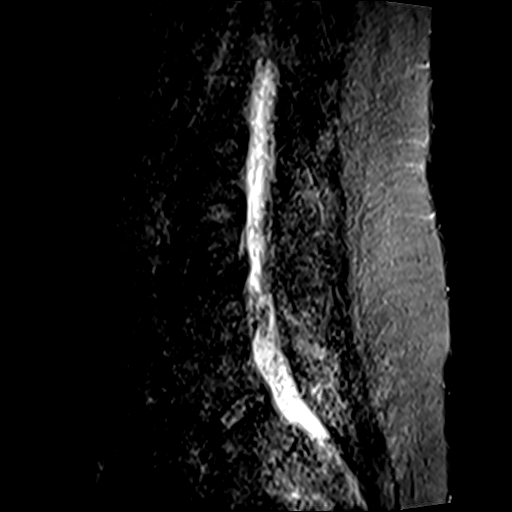
[im 8/15]
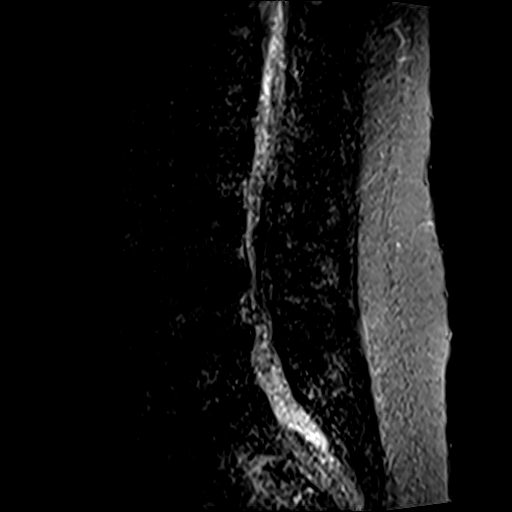
[im 11/15]
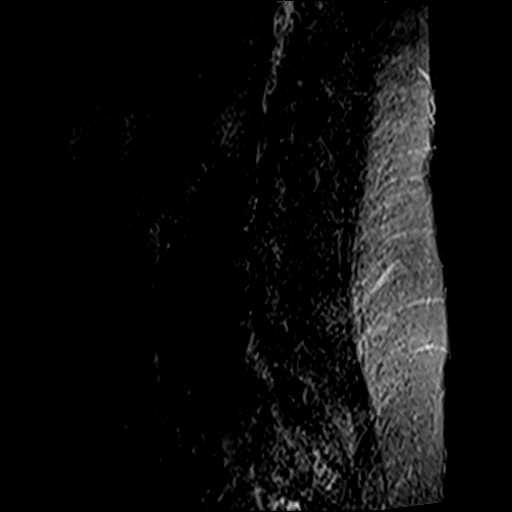

[Series 8: T2 · axial · 4.0mm · 0.78mm/px · z∈[-119,+99]mm · 8 of 40 slices shown (2 of 2)]
[im 1/40]
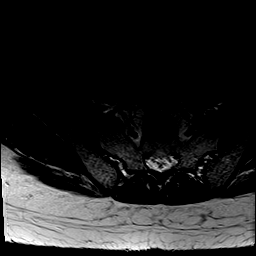
[im 7/40]
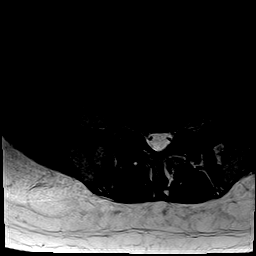
[im 13/40]
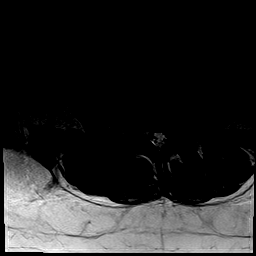
[im 19/40]
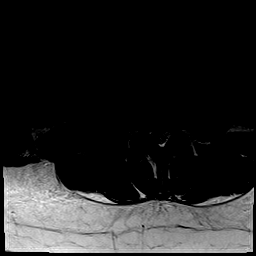
[im 22/40]
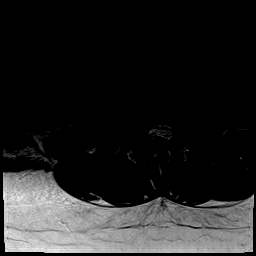
[im 28/40]
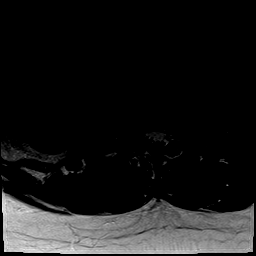
[im 34/40]
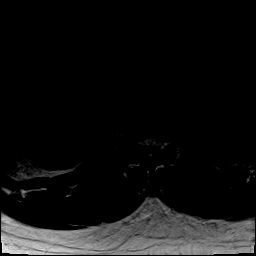
[im 40/40]
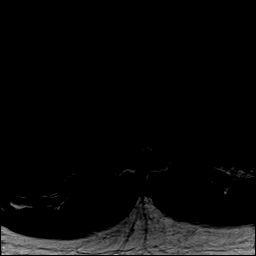

[Series 9: T1 · axial · 4.0mm · 0.39mm/px · z∈[-119,+99]mm · 8 of 40 slices shown (2 of 2)]
[im 1/40]
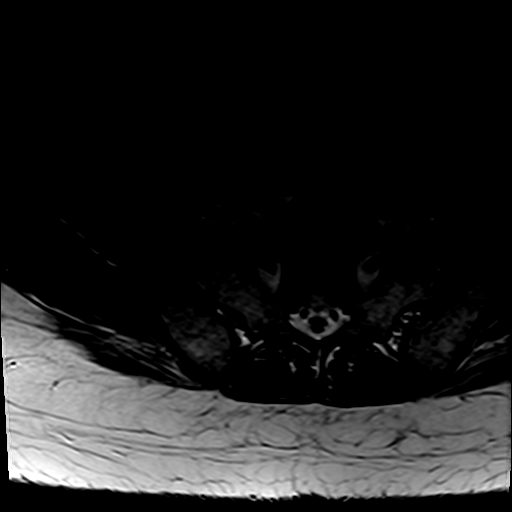
[im 7/40]
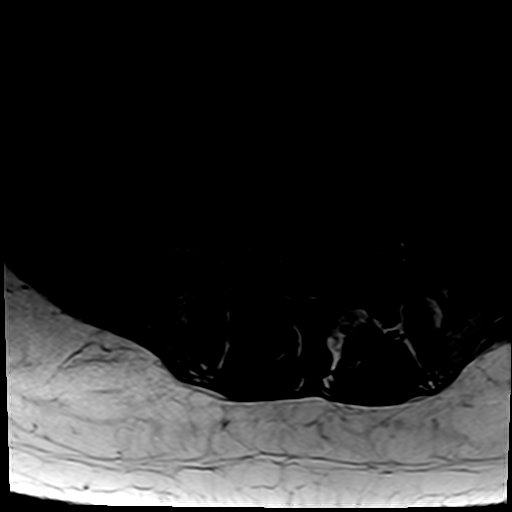
[im 13/40]
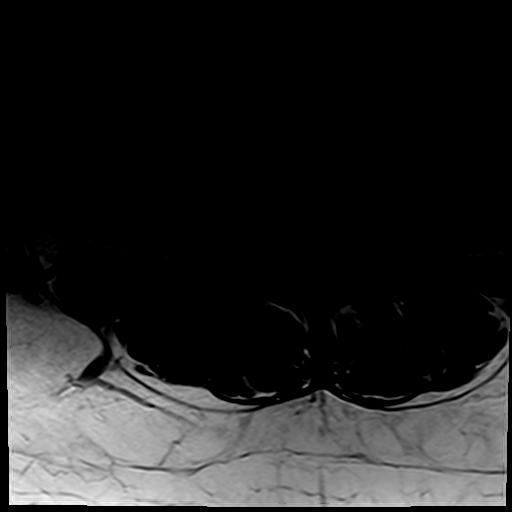
[im 19/40]
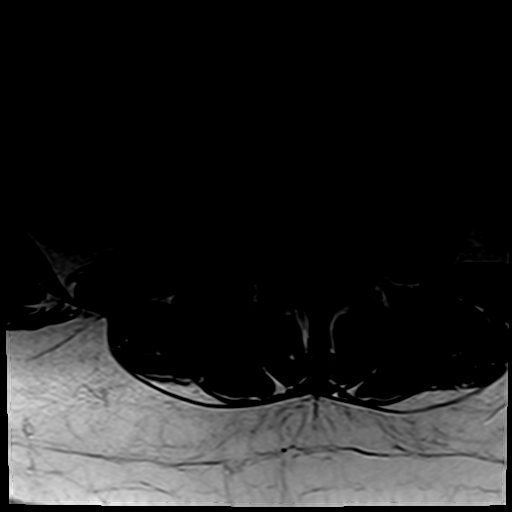
[im 22/40]
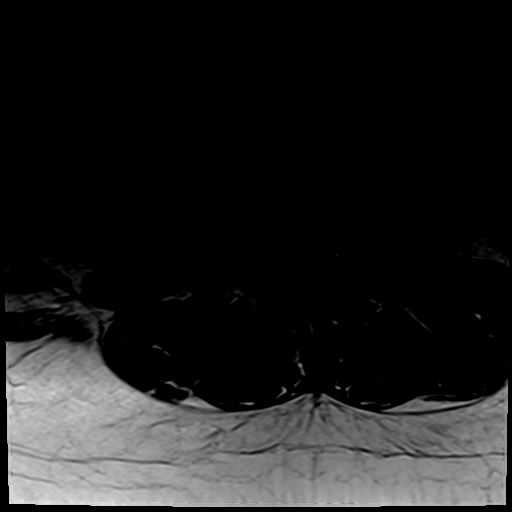
[im 28/40]
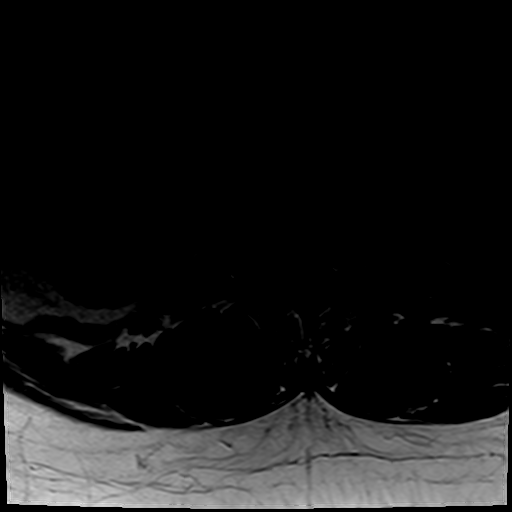
[im 34/40]
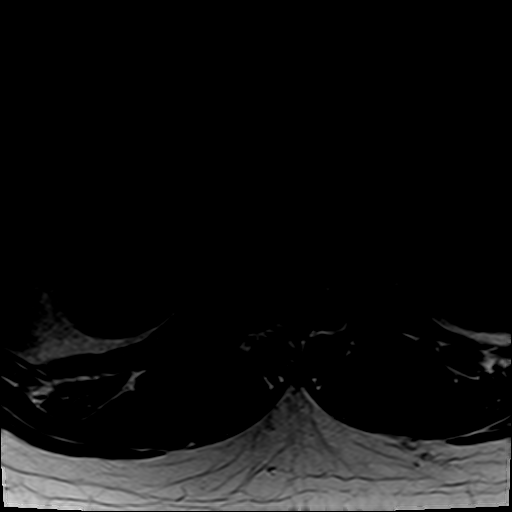
[im 40/40]
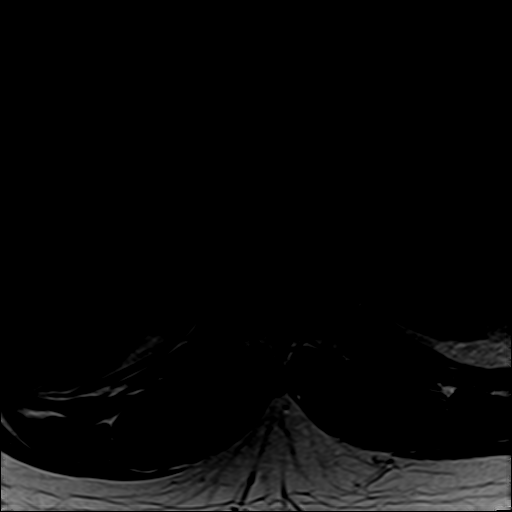

[30 of 48 positions shown; findings below may reference images not displayed]

FINDINGS: The study is moderately motion degraded.

Segmentation: Normal lumbar segmentation is assumed with the lowest
fully formed disc space designated L5-S1.

Alignment:  Mild lumbar levoscoliosis. No significant listhesis.

Vertebrae: No fracture, suspicious marrow lesion, or evidence of
discitis. Moderate Modic type 2 degenerative endplate changes at
L3-4. Milder degenerative endplate changes at L4-5.

Conus medullaris and cauda equina: Conus extends to the L1 level.
Conus and cauda equina appear normal.

Paraspinal and other soft tissues: Unremarkable.

Disc levels:

T12-L1 and L1-2: Negative.

L2-3: Mild disc bulging and mild facet hypertrophy without stenosis.

L3-4: Disc desiccation and severe disc space narrowing. Disc
bulging, a broad central to right foraminal disc extrusion, and mild
facet and ligamentum flavum hypertrophy result in mild bilateral
lateral recess stenosis and moderate to severe right neural
foraminal stenosis with potential right L3 nerve root impingement.
No significant spinal stenosis.

L4-5: Disc desiccation and moderate disc space narrowing. A large
right paracentral disc extrusion with superior migration to the L4
pedicle level results in severe right lateral recess stenosis and
right L4 nerve root impingement. Disc bulging and mild facet and
ligamentum flavum hypertrophy contribute to mild spinal stenosis,
mild-to-moderate left lateral recess stenosis, and mild bilateral
neural foraminal stenosis.

L5-S1: Mild facet hypertrophy without disc herniation or stenosis.
IMPRESSION: 1. Large L4-5 disc extrusion resulting in severe right lateral
recess stenosis and L4 nerve root impingement.
2. Moderate to severe right neural foraminal stenosis at L3-4.
